# Patient Record
Sex: Male | Born: 1980 | Race: Black or African American | Hispanic: No | Marital: Single | Smoking: Never smoker
Health system: Southern US, Community
[De-identification: ages and names within clinical notes are randomized; demographics above are authoritative.]

---

## 2019-05-21 ENCOUNTER — Encounter (HOSPITAL_BASED_OUTPATIENT_CLINIC_OR_DEPARTMENT_OTHER): Payer: Self-pay | Admitting: *Deleted

## 2019-05-21 ENCOUNTER — Other Ambulatory Visit: Payer: Self-pay

## 2019-05-21 ENCOUNTER — Emergency Department (HOSPITAL_BASED_OUTPATIENT_CLINIC_OR_DEPARTMENT_OTHER)
Admission: EM | Admit: 2019-05-21 | Discharge: 2019-05-21 | Disposition: A | Discharge status: Home / Self Care | Payer: Self-pay | Attending: Emergency Medicine | Admitting: Emergency Medicine

## 2019-05-21 DIAGNOSIS — M5412 Radiculopathy, cervical region: Secondary | ICD-10-CM | POA: Insufficient documentation

## 2019-05-21 DIAGNOSIS — M79602 Pain in left arm: Secondary | ICD-10-CM | POA: Insufficient documentation

## 2019-05-21 MED ORDER — NAPROXEN 500 MG PO TABS: 500.0000 mg | ORAL_TABLET | Freq: Two times a day (BID) | ORAL | 0 refills | Status: AC

## 2019-05-21 MED ORDER — KETOROLAC TROMETHAMINE 30 MG/ML IJ SOLN
30.0000 mg | Freq: Once | INTRAMUSCULAR | Status: AC
  Administered 2019-05-21: 30 mg via INTRAMUSCULAR
  Filled 2019-05-21: qty 1

## 2019-05-21 NOTE — ED Triage Notes (Addendum)
C/o left neck/arm pain that started at 2am. States pain radiates down his left arm. States arm feels numb and tingling. Denies any anterior chest pain. Denies any n/v or shortness of breath. States pain is constant and describes as a squeezing. Did not take any meds pta. States he has had this pain before in the past month. Denies any type of injury. Denies any fevers.

## 2019-05-21 NOTE — ED Provider Notes (Signed)
[facility] Provider Note   CSN: [id number] Arrival date & time: 05/21/19  0559     History Chief Complaint  Patient presents with  . shoulder/left arm pain    Alejandro Williams is a 39 y.o. male.  HPI     This is a 39 year old male with no reported past medical history who presents with neck and shoulder pain.  Patient reports he woke up at 2 AM this morning with acute onset of sharp pain that started in his neck and radiated down his left arm.  He reports that the pain becomes numb and tingly into his left lower arm and hand.  This is happened several times before.  Nothing seems to make the pain better or worse including certain movements.  He reports that he sleeps on his back with his arms by his side.  He has not taken anything for the pain.  Currently he rates his pain at 4 out of 10.  However, he states it was much worse earlier.  He denies numbness or tingling.  He denies chest pain, shortness of breath, nausea, vomiting.  Denies headache.  Patient denies any known injury.  He does workout and lift weights.  History reviewed. No pertinent past medical history.  There are no problems to display for this patient.   History reviewed. No pertinent surgical history.     No family history on file.  Social History   Tobacco Use  . Smoking status: Never Smoker  . Smokeless tobacco: Never Used  Substance Use Topics  . Alcohol use: Never  . Drug use: Never    Home Medications Prior to Admission medications   Not on File    Allergies    Patient has no known allergies.  Review of Systems   Review of Systems  Constitutional: Negative for fever.  Respiratory: Negative for shortness of breath.   Cardiovascular: Negative for chest pain.  Gastrointestinal: Negative for abdominal pain, nausea and vomiting.  Genitourinary: Negative for dysuria.  Musculoskeletal: Positive for neck pain.       Left arm pain  Neurological: Positive for  numbness. Negative for weakness and headaches.  All other systems reviewed and are negative.   Physical Exam Updated Vital Signs BP (!) 142/95 (BP Location: Right Arm)   Pulse 63   Temp 97.7 F (36.5 C) (Oral)   Resp 18   Ht 1.829 m (6')   Wt 113.4 kg   SpO2 99%   BMI 33.91 kg/m   Physical Exam Vitals and nursing note reviewed.  Constitutional:      Appearance: He is well-developed.     Comments: Muscular build, overweight  HENT:     Head: Normocephalic and atraumatic.     Nose: Nose normal.  Eyes:     Pupils: Pupils are equal, round, and reactive to light.  Neck:     Comments: Normal range of motion, no meningismus, tenderness to palpation over the left upper trapezius in the region of the brachial plexus, no overlying skin changes, no midline C-spine tenderness to palpation, step-off, deformity Cardiovascular:     Rate and Rhythm: Normal rate and regular rhythm.  Pulmonary:     Effort: Pulmonary effort is normal. No respiratory distress.  Abdominal:     Palpations: Abdomen is soft.     Tenderness: There is no abdominal tenderness.  Musculoskeletal:        General: No deformity or signs of injury. Normal range of motion.  Cervical back: Normal range of motion and neck supple.     Right lower leg: No edema.     Left lower leg: No edema.  Lymphadenopathy:     Cervical: No cervical adenopathy.  Skin:    General: Skin is warm and dry.  Neurological:     Mental Status: He is alert and oriented to person, place, and time.     Comments: Cranial nerves II through XII intact, 5 out of 5 strength in all 4 extremities, specifically 5 out of 5 grip, biceps, triceps, deltoids bilaterally, normal reflexes  Psychiatric:     Comments: Anxious     ED Results / Procedures / Treatments   Labs (all labs ordered are listed, but only abnormal results are displayed) Labs Reviewed - No data to display  EKG EKG Interpretation  Date/Time:  Monday May 21 2019 06:50:15  EDT Ventricular Rate:  67 PR Interval:    QRS Duration: 76 QT Interval:  404 QTC Calculation: 427 R Axis:   73 Text Interpretation: Sinus rhythm No prior for comparison Confirmed by Ross Marcus ([id number]) on 05/21/2019 6:58:38 AM   Radiology No results found.  Procedures Procedures (including critical care time)  Medications Ordered in ED Medications  ketorolac (TORADOL) 30 MG/ML injection 30 mg (has no administration in time range)    ED Course  I have reviewed the triage vital signs and the nursing notes.  Pertinent labs & imaging results that were available during my care of the patient were reviewed by me and considered in my medical decision making (see chart for details).    MDM Rules/Calculators/A&P                       Patient presents with left neck and arm pain and numbness.  Acute in onset.  He is overall nontoxic and vital signs notable for blood pressure of 142/95.  He has some tenderness in the left trapezius and is significantly hypertrophied and muscular.  Suspect radicular pain radiating from the brachial plexus.  He is neurologically intact.  After further questioning, patient states that he googled his symptoms and is concerned about his heart.  He denies chest pain or shortness of breath.  He has no exertional symptoms.  I discussed my suspicions with him that this was likely radicular in nature and not ACS.  He is fairly low risk.  He has no documented history of hypertension although he is slightly hypertensive here and overweight but he has no early family history of heart disease or other risk factors.  Heart score is 1.  I did obtain a screening EKG which is normal.  Patient is reassured.  Would recommend anti-inflammatories and ice.  Avoid lifting weights that would stress or further inflamed that muscle.  Patient stated understanding.  Final Clinical Impression(s) / ED Diagnoses Final diagnoses:  Cervical radiculopathy    Rx / DC Orders ED Discharge  Orders    None       Shon Baton, MD 05/21/19 [phone]

## 2019-05-21 NOTE — ED Notes (Signed)
MD with pt  

## 2019-05-21 NOTE — Discharge Instructions (Addendum)
You were seen today for left arm pain.  This is likely related to a pinched nerve in your neck or shoulder.  Take medications as prescribed.  Use ice.  Avoid heavy lifting or weightlifting that would aggravate or inflame that shoulder.  Your EKG is normal which is very reassuring.

## 2019-07-31 ENCOUNTER — Encounter (HOSPITAL_BASED_OUTPATIENT_CLINIC_OR_DEPARTMENT_OTHER): Payer: Self-pay | Admitting: *Deleted

## 2019-07-31 ENCOUNTER — Emergency Department (HOSPITAL_BASED_OUTPATIENT_CLINIC_OR_DEPARTMENT_OTHER)
Admission: EM | Admit: 2019-07-31 | Discharge: 2019-07-31 | Disposition: A | Discharge status: Home / Self Care | Payer: Self-pay | Attending: Emergency Medicine | Admitting: Emergency Medicine

## 2019-07-31 ENCOUNTER — Emergency Department (HOSPITAL_BASED_OUTPATIENT_CLINIC_OR_DEPARTMENT_OTHER): Payer: Self-pay

## 2019-07-31 ENCOUNTER — Other Ambulatory Visit: Payer: Self-pay

## 2019-07-31 DIAGNOSIS — R200 Anesthesia of skin: Secondary | ICD-10-CM

## 2019-07-31 DIAGNOSIS — I10 Essential (primary) hypertension: Secondary | ICD-10-CM | POA: Insufficient documentation

## 2019-07-31 DIAGNOSIS — R202 Paresthesia of skin: Secondary | ICD-10-CM | POA: Insufficient documentation

## 2019-07-31 DIAGNOSIS — R519 Headache, unspecified: Secondary | ICD-10-CM | POA: Insufficient documentation

## 2019-07-31 LAB — CBC WITH DIFFERENTIAL/PLATELET
Abs Immature Granulocytes: 0.04 10*3/uL (ref 0.00–0.07)
Basophils Absolute: 0 10*3/uL (ref 0.0–0.1)
Basophils Relative: 0 %
Eosinophils Absolute: 0.1 10*3/uL (ref 0.0–0.5)
Eosinophils Relative: 1 %
HCT: 47.7 % (ref 39.0–52.0)
Hemoglobin: 15.7 g/dL (ref 13.0–17.0)
Immature Granulocytes: 0 %
Lymphocytes Relative: 29 %
Lymphs Abs: 2.7 10*3/uL (ref 0.7–4.0)
MCH: 28.3 pg (ref 26.0–34.0)
MCHC: 32.9 g/dL (ref 30.0–36.0)
MCV: 85.9 fL (ref 80.0–100.0)
Monocytes Absolute: 0.8 10*3/uL (ref 0.1–1.0)
Monocytes Relative: 9 %
Neutro Abs: 5.5 10*3/uL (ref 1.7–7.7)
Neutrophils Relative %: 61 %
Platelets: 283 10*3/uL (ref 150–400)
RBC: 5.55 MIL/uL (ref 4.22–5.81)
RDW: 12.4 % (ref 11.5–15.5)
WBC: 9.1 10*3/uL (ref 4.0–10.5)
nRBC: 0 % (ref 0.0–0.2)

## 2019-07-31 LAB — BASIC METABOLIC PANEL
Anion gap: 9 (ref 5–15)
BUN: 13 mg/dL (ref 6–20)
CO2: 25 mmol/L (ref 22–32)
Calcium: 9 mg/dL (ref 8.9–10.3)
Chloride: 101 mmol/L (ref 98–111)
Creatinine, Ser: 1.29 mg/dL — ABNORMAL HIGH (ref 0.61–1.24)
GFR calc Af Amer: >60 mL/min (ref >60)
GFR calc non Af Amer: >60 mL/min (ref >60)
Glucose, Bld: 97 mg/dL (ref 70–99)
Potassium: 3.5 mmol/L (ref 3.5–5.1)
Sodium: 135 mmol/L (ref 135–145)

## 2019-07-31 LAB — MAGNESIUM: Magnesium: 2 mg/dL (ref 1.7–2.4)

## 2019-07-31 NOTE — Discharge Instructions (Addendum)
I want you to continue to keep a log of your blood pressure.  If it is persistently elevated you need to follow-up and will likely need to be started on blood pressure medication.  I suspect the symptoms you are having in your left upper extremity are from a peripheral nerve problem, i.e. a pinched nerve.  Your labs today are unremarkable.  The CT of your head does not show any acute abnormality.

## 2019-07-31 NOTE — ED Triage Notes (Signed)
HTN. Pressure in her left arm and head. He is ambulatory. States he had the same thing a month ago and all of his test were normal at that time.

## 2019-08-02 NOTE — ED Provider Notes (Signed)
[facility] Provider Note   CSN: [id number] Arrival date & time: 07/31/19  1825     History Chief Complaint  Patient presents with  . Hypertension    Alejandro Williams is a 39 y.o. male.  HPI   39 year old male with several complaints.  Primarily numbness in his left upper extremity.  Extend from shoulder down to his hand.  No weakness.  No neck pain.  Also intermittent headaches.  He is here concerned about his blood pressure and symptoms may be related.  He also reports that his father had significant issues related to neuro sarcoid is concerned about this as well.  History reviewed. No pertinent past medical history.  There are no problems to display for this patient.   History reviewed. No pertinent surgical history.     No family history on file.  Social History   Tobacco Use  . Smoking status: Never Smoker  . Smokeless tobacco: Never Used  Substance Use Topics  . Alcohol use: Never  . Drug use: Never    Home Medications Prior to Admission medications   Medication Sig Start Date End Date Taking? Authorizing Provider  naproxen (NAPROSYN) 500 MG tablet Take 1 tablet (500 mg total) by mouth 2 (two) times daily. 05/21/19   Horton, Barbette Hair, MD    Allergies    Patient has no known allergies.  Review of Systems   Review of Systems All systems reviewed and negative, other than as noted in HPI.  Physical Exam Updated Vital Signs BP (!) 141/102 (BP Location: Right Arm)   Pulse 74   Temp 98.8 F (37.1 C) (Oral)   Resp 19   Ht 6' (1.829 m)   Wt 119.7 kg   SpO2 100%   BMI 35.80 kg/m   Physical Exam Vitals and nursing note reviewed.  Constitutional:      General: He is not in acute distress.    Appearance: He is well-developed.  HENT:     Head: Normocephalic and atraumatic.  Eyes:     General:        Right eye: No discharge.        Left eye: No discharge.     Conjunctiva/sclera: Conjunctivae normal.  Cardiovascular:    Rate and Rhythm: Normal rate and regular rhythm.     Heart sounds: Normal heart sounds. No murmur. No friction rub. No gallop.   Pulmonary:     Effort: Pulmonary effort is normal. No respiratory distress.     Breath sounds: Normal breath sounds.  Abdominal:     General: There is no distension.     Palpations: Abdomen is soft.     Tenderness: There is no abdominal tenderness.  Musculoskeletal:        General: No tenderness.     Cervical back: Neck supple.  Skin:    General: Skin is warm and dry.  Neurological:     Mental Status: He is alert.     Cranial Nerves: No cranial nerve deficit.     Sensory: No sensory deficit.     Motor: No weakness.     Coordination: Coordination normal.  Psychiatric:        Behavior: Behavior normal.        Thought Content: Thought content normal.     ED Results / Procedures / Treatments   Labs (all labs ordered are listed, but only abnormal results are displayed) Labs Reviewed  BASIC METABOLIC PANEL - Abnormal; Notable for the following components:  Result Value   Creatinine, Ser 1.29 (*)    All other components within normal limits  CBC WITH DIFFERENTIAL/PLATELET  MAGNESIUM    EKG EKG Interpretation  Date/Time:  Tuesday July 31 2019 18:47:36 EDT Ventricular Rate:  92 PR Interval:    QRS Duration: 85 QT Interval:  353 QTC Calculation: 437 R Axis:   58 Text Interpretation: Sinus rhythm Confirmed by Raeford Razor [phone]) on 07/31/2019 6:50:01 PM Also confirmed by Raeford Razor [phone]), editor Elita Quick ([id number])  on 08/01/2019 9:40:39 AM   Radiology CT Head Wo Contrast  Result Date: 07/31/2019 CLINICAL DATA:  Left-sided headache for several months, initial encounter EXAM: CT HEAD WITHOUT CONTRAST TECHNIQUE: Contiguous axial images were obtained from the base of the skull through the vertex without intravenous contrast. COMPARISON:  None. FINDINGS: Brain: No evidence of acute infarction, hemorrhage, hydrocephalus, extra-axial  collection or mass lesion/mass effect. Vascular: No hyperdense vessel or unexpected calcification. Skull: Normal. Negative for fracture or focal lesion. Sinuses/Orbits: No acute finding. Other: None. IMPRESSION: No acute intracranial abnormality noted. Electronically Signed   By: Alcide Clever M.D.   On: 07/31/2019 19:55    Procedures Procedures (including critical care time)  Medications Ordered in ED Medications - No data to display  ED Course  I have reviewed the triage vital signs and the nursing notes.  Pertinent labs & imaging results that were available during my care of the patient were reviewed by me and considered in my medical decision making (see chart for details).    MDM Rules/Calculators/A&P                      39 year old male with what I suspect is a cervical radiculopathy.  I doubt stroke or other emergent condition.  Blood pressure is elevated.  Discussed the need for consistent outpatient follow-up.  There may be an anxiety component.  CT the head does not show any acute abnormality.  Return precautions discussed.  Outpatient follow-up Eliquis.  Final Clinical Impression(s) / ED Diagnoses Final diagnoses:  Hypertension, unspecified type  Left arm numbness    Rx / DC Orders ED Discharge Orders    None       Raeford Razor, MD 08/02/19 1104

## 2021-12-17 IMAGING — CT CT HEAD W/O CM
3 series · 14 of 47 positions shown, 16 images · non-contrast
Comparison: None.

CLINICAL DATA: Left-sided headache for several months, initial
encounter

EXAM:
CT HEAD WITHOUT CONTRAST
TECHNIQUE: Contiguous axial images were obtained from the base of the skull
through the vertex without intravenous contrast.

[Series 2: head wo · axial · 0.54mm/px · z∈[-110,+26]mm · 8 of 33 slices shown, 10 images]
[im 3/33  brain]
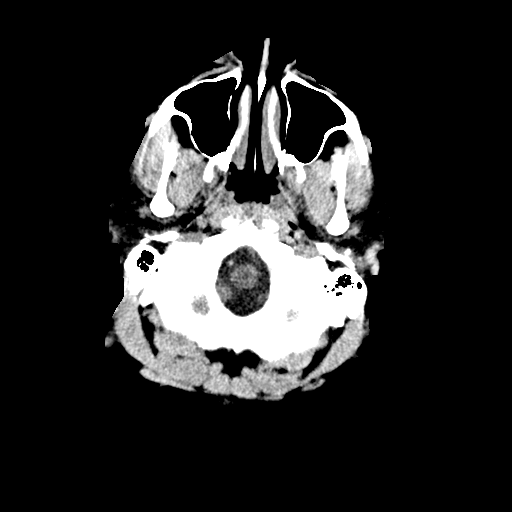
[im 3/33  bone]
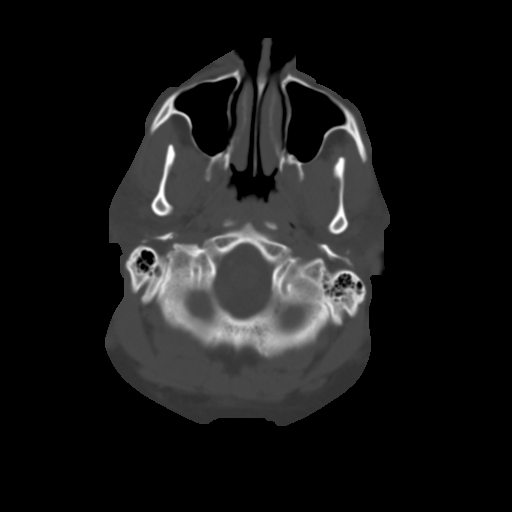
[im 7/33  brain]
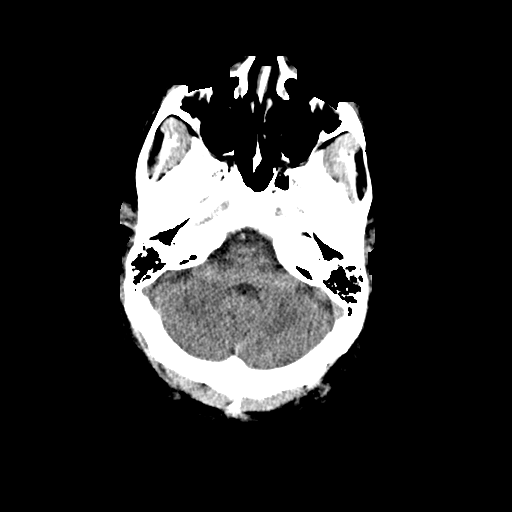
[im 10/33  brain]
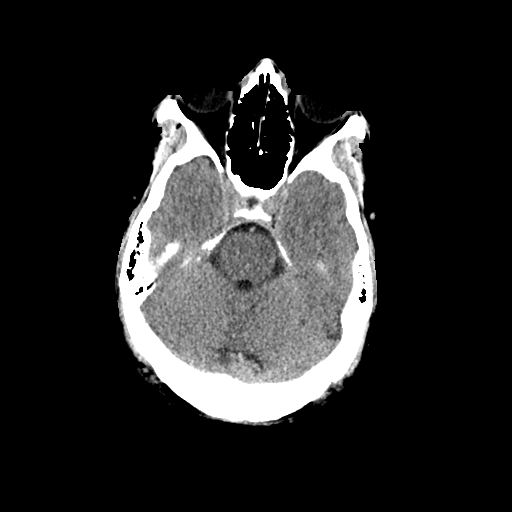
[im 15/33  brain]
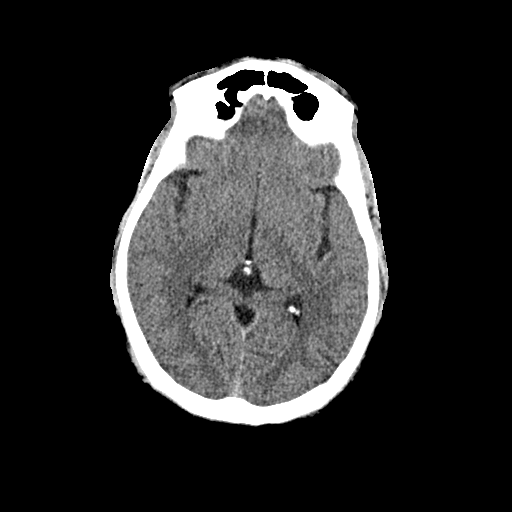
[im 18/33  brain]
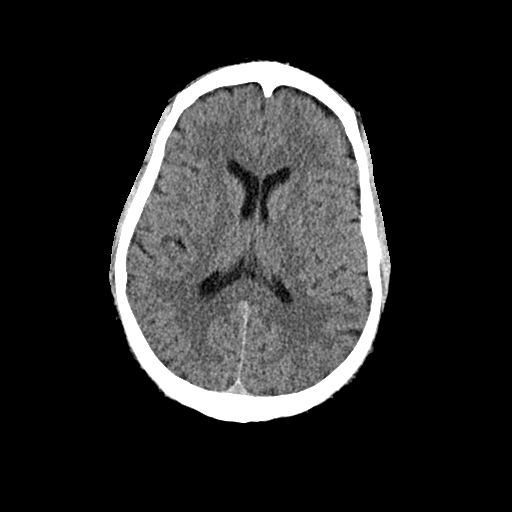
[im 18/33  bone]
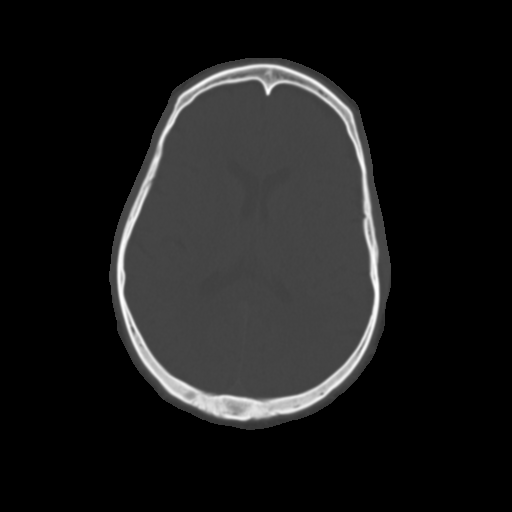
[im 23/33  brain]
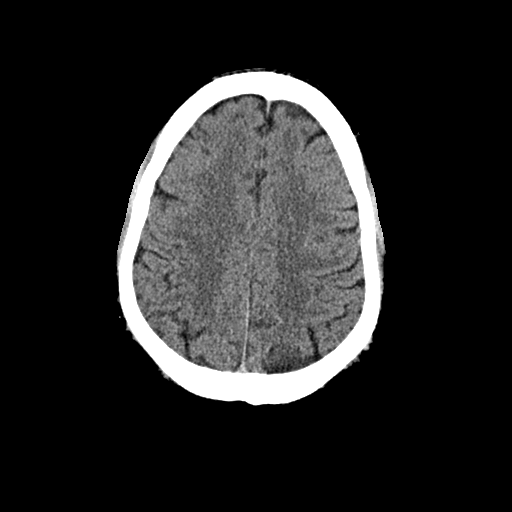
[im 26/33  brain]
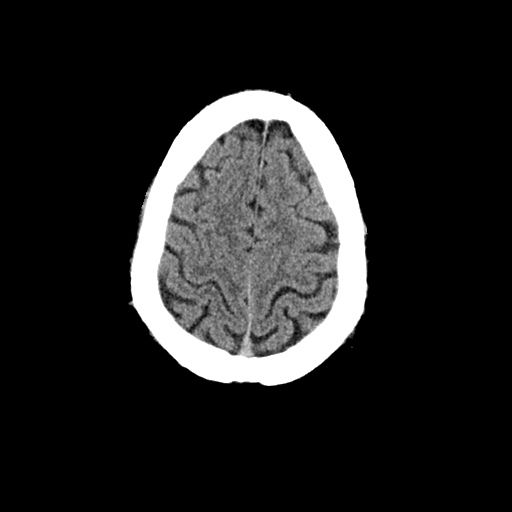
[im 30/33  brain]
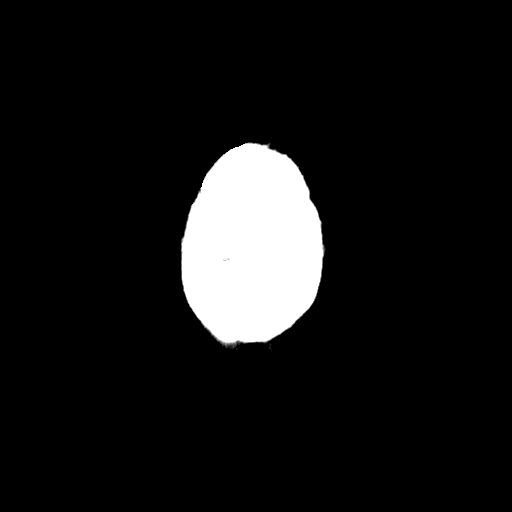

[Series 4: coronal soft · coronal · 0.35mm/px · 3 of 78 slices shown]
[im 26/78  brain]
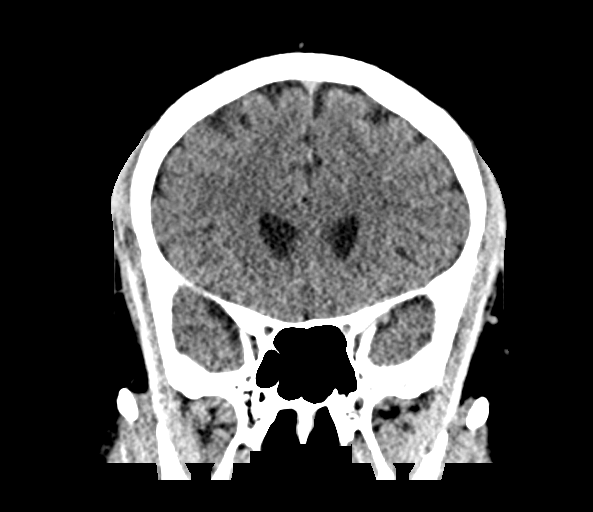
[im 35/78  brain]
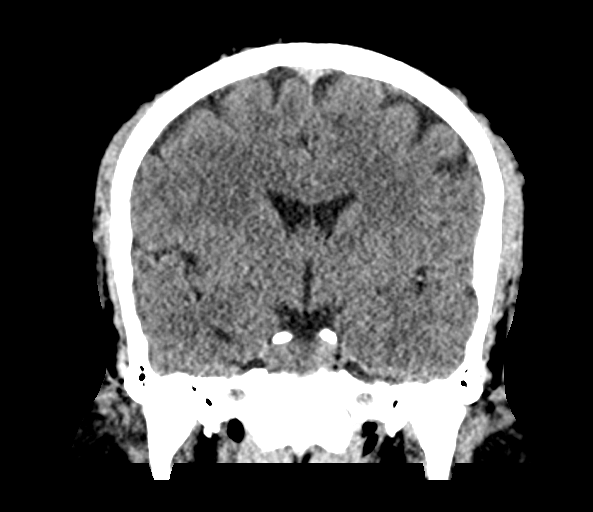
[im 43/78  brain]
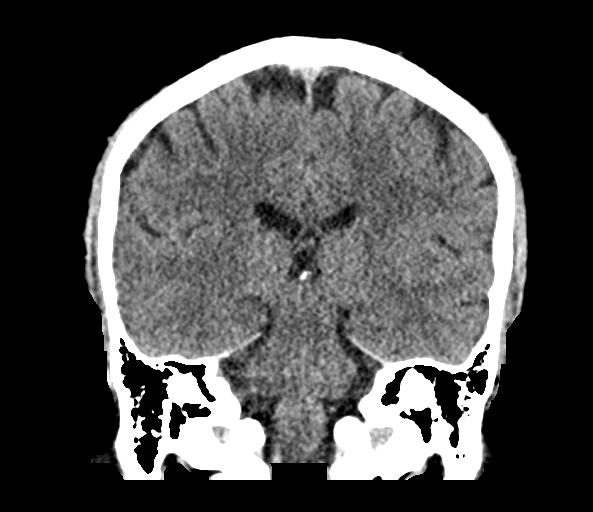

[Series 5: sag soft · sagittal · 0.31mm/px · 3 of 67 slices shown]
[im 23/67  brain]
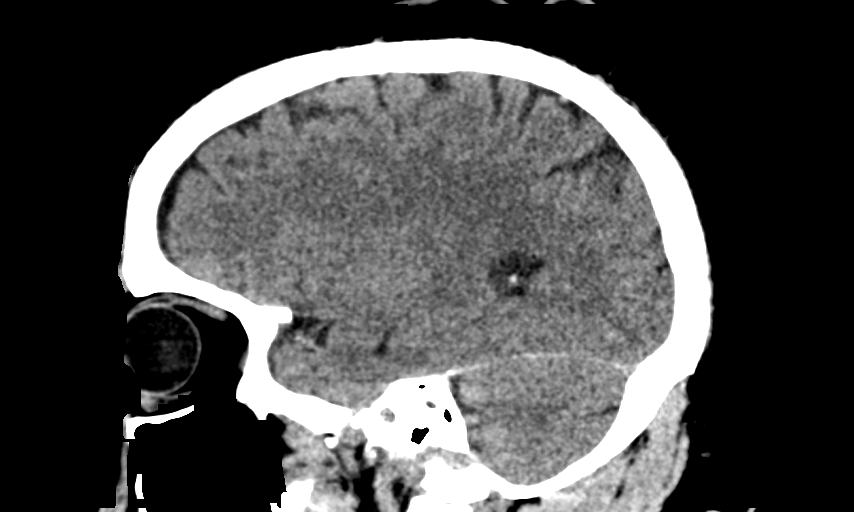
[im 34/67  brain]
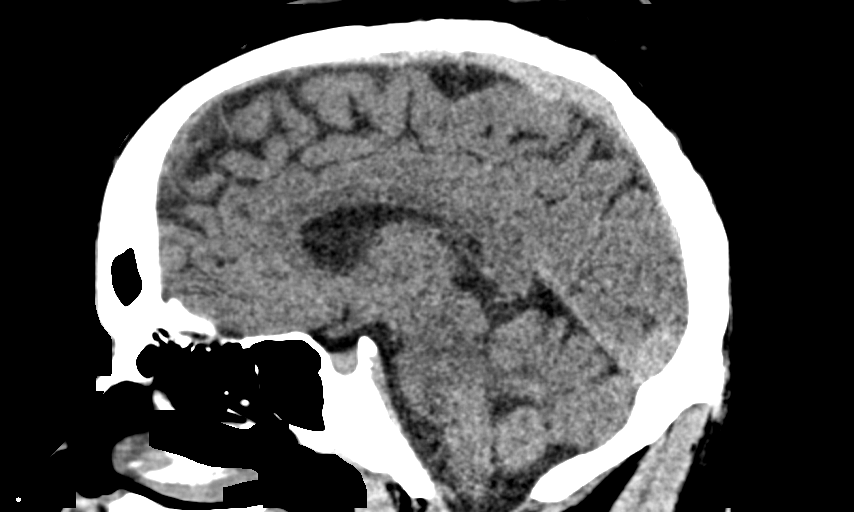
[im 45/67  brain]
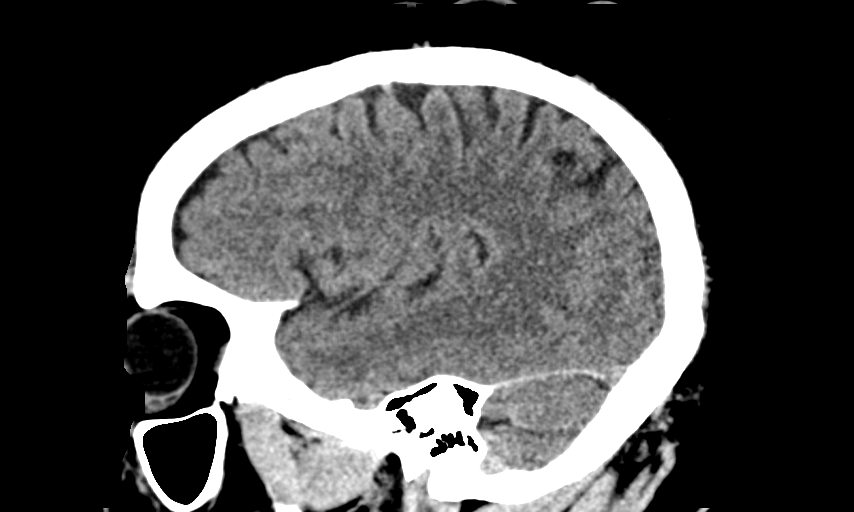

[14 of 47 positions shown; findings below may reference images not displayed]

FINDINGS: Brain: No evidence of acute infarction, hemorrhage, hydrocephalus,
extra-axial collection or mass lesion/mass effect.

Vascular: No hyperdense vessel or unexpected calcification.

Skull: Normal. Negative for fracture or focal lesion.

Sinuses/Orbits: No acute finding.

Other: None.
IMPRESSION: No acute intracranial abnormality noted.
